# Patient Record
Sex: Male | Born: 1940 | Race: White | Hispanic: No | Marital: Single
Health system: Southern US, Community
[De-identification: ages and names within clinical notes are randomized; demographics above are authoritative.]

## PROBLEM LIST (undated history)

## (undated) DIAGNOSIS — H3320 Serous retinal detachment, unspecified eye: Secondary | ICD-10-CM

---

## 2017-01-30 ENCOUNTER — Emergency Department
Admission: EM | Admit: 2017-01-30 | Discharge: 2017-02-04 | Disposition: A | Discharge status: Home / Self Care | Payer: Self-pay | Attending: Student in an Organized Health Care Education/Training Program | Admitting: Student in an Organized Health Care Education/Training Program

## 2017-01-30 ENCOUNTER — Emergency Department: Payer: Self-pay

## 2017-01-30 ENCOUNTER — Encounter: Payer: Self-pay | Admitting: Emergency Medicine

## 2017-01-30 DIAGNOSIS — F22 Delusional disorders: Secondary | ICD-10-CM

## 2017-01-30 DIAGNOSIS — F28 Other psychotic disorder not due to a substance or known physiological condition: Secondary | ICD-10-CM

## 2017-01-30 DIAGNOSIS — F1210 Cannabis abuse, uncomplicated: Secondary | ICD-10-CM

## 2017-01-30 DIAGNOSIS — F3172 Bipolar disorder, in full remission, most recent episode hypomanic: Secondary | ICD-10-CM

## 2017-01-30 DIAGNOSIS — F4325 Adjustment disorder with mixed disturbance of emotions and conduct: Secondary | ICD-10-CM

## 2017-01-30 DIAGNOSIS — F29 Unspecified psychosis not due to a substance or known physiological condition: Secondary | ICD-10-CM | POA: Insufficient documentation

## 2017-01-30 DIAGNOSIS — F23 Brief psychotic disorder: Secondary | ICD-10-CM

## 2017-01-30 HISTORY — DX: Serous retinal detachment, unspecified eye: H33.20

## 2017-01-30 LAB — COMPREHENSIVE METABOLIC PANEL
ALK PHOS: 75 U/L (ref 38–126)
ALT: 17 U/L (ref 17–63)
AST: 28 U/L (ref 15–41)
Albumin: 4.3 g/dL (ref 3.5–5.0)
Anion gap: 11 (ref 5–15)
BUN: 11 mg/dL (ref 6–20)
CALCIUM: 9 mg/dL (ref 8.9–10.3)
CO2: 25 mmol/L (ref 22–32)
CREATININE: 1.12 mg/dL (ref 0.61–1.24)
Chloride: 102 mmol/L (ref 101–111)
Glucose, Bld: 146 mg/dL — ABNORMAL HIGH (ref 65–99)
Potassium: 3.6 mmol/L (ref 3.5–5.1)
SODIUM: 138 mmol/L (ref 135–145)
Total Bilirubin: 0.6 mg/dL (ref 0.3–1.2)
Total Protein: 8.3 g/dL — ABNORMAL HIGH (ref 6.5–8.1)

## 2017-01-30 LAB — CBC
HCT: 41.3 % (ref 40.0–52.0)
HEMOGLOBIN: 13.8 g/dL (ref 13.0–18.0)
MCH: 30.1 pg (ref 26.0–34.0)
MCHC: 33.4 g/dL (ref 32.0–36.0)
MCV: 90.3 fL (ref 80.0–100.0)
Platelets: 288 10*3/uL (ref 150–440)
RBC: 4.57 MIL/uL (ref 4.40–5.90)
RDW: 13.9 % (ref 11.5–14.5)
WBC: 9.9 10*3/uL (ref 3.8–10.6)

## 2017-01-30 LAB — URINE DRUG SCREEN, QUALITATIVE (ARMC ONLY)
Amphetamines, Ur Screen: NOT DETECTED
BARBITURATES, UR SCREEN: NOT DETECTED
Benzodiazepine, Ur Scrn: NOT DETECTED
CANNABINOID 50 NG, UR ~~LOC~~: POSITIVE — AB
COCAINE METABOLITE, UR ~~LOC~~: NOT DETECTED
MDMA (Ecstasy)Ur Screen: NOT DETECTED
Methadone Scn, Ur: NOT DETECTED
OPIATE, UR SCREEN: NOT DETECTED
PHENCYCLIDINE (PCP) UR S: NOT DETECTED
TRICYCLIC, UR SCREEN: NOT DETECTED

## 2017-01-30 LAB — SALICYLATE LEVEL

## 2017-01-30 LAB — ACETAMINOPHEN LEVEL: Acetaminophen (Tylenol), Serum: <10 ug/mL — ABNORMAL LOW (ref 10–30)

## 2017-01-30 LAB — ETHANOL: Alcohol, Ethyl (B): <5 mg/dL (ref <5)

## 2017-01-30 MED ORDER — ZIPRASIDONE MESYLATE 20 MG IM SOLR
20.0000 mg | Freq: Once | INTRAMUSCULAR | Status: AC
  Administered 2017-01-30: 20 mg via INTRAMUSCULAR

## 2017-01-30 MED ORDER — RISPERIDONE 1 MG PO TABS
1.0000 mg | ORAL_TABLET | Freq: Two times a day (BID) | ORAL | Status: DC
  Administered 2017-01-30 – 2017-02-04 (×9): 1 mg via ORAL
  Filled 2017-01-30 (×10): qty 1

## 2017-01-30 MED ORDER — LORAZEPAM 2 MG/ML IJ SOLN: 2.0000 mg | Freq: Once | INTRAMUSCULAR | Status: DC

## 2017-01-30 MED ORDER — ZIPRASIDONE MESYLATE 20 MG IM SOLR
INTRAMUSCULAR | Status: AC
  Administered 2017-01-30: 20 mg via INTRAMUSCULAR
  Filled 2017-01-30: qty 20

## 2017-01-30 MED ORDER — LORAZEPAM 2 MG/ML IJ SOLN
2.0000 mg | Freq: Once | INTRAMUSCULAR | Status: AC
  Administered 2017-01-30: 2 mg via INTRAMUSCULAR
  Filled 2017-01-30: qty 1

## 2017-01-30 MED ORDER — ZIPRASIDONE MESYLATE 20 MG IM SOLR
20.0000 mg | Freq: Once | INTRAMUSCULAR | Status: AC
  Administered 2017-01-30: 20 mg via INTRAMUSCULAR
  Filled 2017-01-30: qty 20

## 2017-01-30 NOTE — ED Notes (Signed)
BEHAVIORAL HEALTH ROUNDING Patient sleeping: No. Patient alert and oriented: yes Behavior appropriate: No.; If no, describe: Refusing medication, occasionally yelling out, occasionally sitting on the floor: MD aware Nutrition and fluids offered: Yes  Toileting and hygiene offered: Yes  Sitter present: q 15 min checks Law enforcement present: Yes

## 2017-01-30 NOTE — ED Notes (Signed)
BEHAVIORAL HEALTH ROUNDING  Patient sleeping: No.  Patient alert and oriented: yes  Behavior appropriate: Yes. ; If no, describe:  Nutrition and fluids offered: Yes  Toileting and hygiene offered: Yes  Sitter present: not applicable, Q 15 min safety rounds and observation.  Law enforcement present: Yes ODS  

## 2017-01-30 NOTE — ED Notes (Signed)
Patient willingly moved to stretcher from squatting position on the floor and allowed RN to give Ativan injection. Post injection patient was yelling that staff were, "assholes and two-faced."  Patient encouraged to not yell and door was shut to decrease noise to the unit.  Shades on the door were open and lights were on.
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
## 2017-01-30 NOTE — ED Notes (Signed)
BEHAVIORAL HEALTH ROUNDING Patient sleeping: No. Patient alert and oriented: yes Behavior appropriate: Yes.  ; If no, describe:  Nutrition and fluids offered: Yes  Toileting and hygiene offered: Yes  Sitter present: q 15 min checks Law enforcement present: Yes  

## 2017-01-30 NOTE — ED Notes (Signed)
BEHAVIORAL HEALTH ROUNDING Patient sleeping: Yes.   Patient alert and oriented: not applicable SLEEPING Behavior appropriate: Yes.  ; If no, describe: SLEEPING Nutrition and fluids offered: No SLEEPING Toileting and hygiene offered: NoSLEEPING Sitter present: not applicable, Q 15 min safety rounds and observation. Law enforcement present: Yes ODS 

## 2017-01-30 NOTE — ED Triage Notes (Addendum)
Pt presents to ED in custody of [employer], Counselling psychologistfficer Bennett. Per Counselling psychologistfficer Bennett, pt called police due to hearing shots fired, upon police arrival patient was noted to be paranoid with psychotic behavior. Upon arrival to ER, pt refuses to give name and birthday, stating "I was born on the moon, I don't have a birthday". Pt requested to know the time, when patient told the time, pt states "No, I mean the time on the moon!". 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 It's not often that you get to see a [age] year olds dick!" Pt states that he was killed in 2007. [employer] also reports that upon their arrival to patient's home, patient stated that he wanted to die and that he wished [employer] would have shot him and left him in a pile of ashes. Pt changed into scrubs voluntarily, per [employer] IVC papers en route to hospital at this time. When Mellody DanceKeith, Medic drawing blood, pt states, "you're going to find out I'm an alien".

## 2017-01-30 NOTE — ED Notes (Addendum)
Patient began punching the glass on the door hard where it was making a loud noise.  Patient also giving a middle finger to the staff.  Patient asked to stop punching the door and try to relax.  Patient punched the door again.  Kyle PartridgeKeith Caldwell, EDT and Officer [facility] assisted patient back to the bed and held him down by his right and left arm for approximately one minute.  This RN acted as witness.  Once patient stopped struggling he was immediately released.  MD notified that patient was "acting out" and a potential harm to himself.  MD gave verbal orders for 20mg  of geodon IM.

## 2017-01-30 NOTE — ED Notes (Signed)
MD to bedside at this time. IVC paper work arrived to hospital via [employer], Technical sales engineerfficer [place].

## 2017-01-30 NOTE — ED Notes (Signed)
Patient given ginger ale at this time. 

## 2017-01-30 NOTE — ED Notes (Signed)
1 pair of blue pants, 1 pair of brown shoes, 1 pair of socks, 1 gray ring with a blue/green stone placed in patient belongings bag. Pt changed out by Mellody DanceKeith, Medic with assitance from [facility], [employer] Bennett.

## 2017-01-30 NOTE — ED Notes (Signed)
Patient became upset and threw the telephone and slammed the door.  Then patient threw the remainder of his meal tray which included grapes and carrot sticks.  MD notified and acknowledged.  New orders received.

## 2017-01-30 NOTE — ED Provider Notes (Signed)
[facility] Emergency Department Provider Note    None    (approximate)  I have reviewed the triage vital signs and the nursing notes.   HISTORY  Chief Complaint Psychiatric Evaluation  Level V Caveat:  Acute encephalopathy - psychosis  HPI Kyle Caldwell is a 76 y.o. male presents from with acute paranoid psychosis. Patient was brought in under IVC by police after hearing shots fired. Patient very disorganized. States he was "brought against his will to this facility where there are demons and monsters."  Patient denies any other pain.  No substance abuse.   Past Medical History:  Diagnosis Date  . Detached retina    No family history on file. History reviewed. No pertinent surgical history. There are no active problems to display for this patient.     Prior to Admission medications   Not on File    Allergies Morphine and related    Social History Social History  Substance Use Topics  . Smoking status: Not on file  . Smokeless tobacco: Not on file  . Alcohol use Not on file    Review of Systems Patient denies headaches, rhinorrhea, blurry vision, numbness, shortness of breath, chest pain, edema, cough, abdominal pain, nausea, vomiting, diarrhea, dysuria, fevers, rashes unless otherwise stated above in HPI. ____________________________________________   PHYSICAL EXAM:  VITAL SIGNS: Vitals:   01/30/17 0824  BP: (!) 138/106  Pulse: 85  Resp: 18  Temp: 98 F (36.7 C)  SpO2: 99%    Constitutional: Alert, acutely agitated and paranoid, intermittently shouting at staff Eyes: Conjunctivae are normal.  Head: Atraumatic. Nose: No congestion/rhinnorhea. Mouth/Throat: Mucous membranes are moist.   Neck: No stridor. Painless ROM.  Cardiovascular: Normal rate, regular rhythm. Grossly normal heart sounds.  Good peripheral circulation. Respiratory: Normal respiratory effort.  No retractions. Lungs CTAB. Gastrointestinal: Soft and  nontender. No distention. No abdominal bruits. No CVA tenderness. Musculoskeletal: No lower extremity tenderness nor edema.  No joint effusions. Neurologic:  Normal speech and language. No gross focal neurologic deficits are appreciated. No facial droop Skin:  Skin is warm, dry and intact. No rash noted. Psychiatric: agitated, disorganized thought process ____________________________________________   LABS (all labs ordered are listed, but only abnormal results are displayed)  Results for orders placed or performed during the hospital encounter of 01/30/17 (from the past 24 hour(s))  Comprehensive metabolic panel     Status: Abnormal   Collection Time: 01/30/17  8:16 AM  Result Value Ref Range   Sodium 138 135 - 145 mmol/L   Potassium 3.6 3.5 - 5.1 mmol/L   Chloride 102 101 - 111 mmol/L   CO2 25 22 - 32 mmol/L   Glucose, Bld 146 (H) 65 - 99 mg/dL   BUN 11 6 - 20 mg/dL   Creatinine, Ser 1.611.12 0.61 - 1.24 mg/dL   Calcium 9.0 8.9 - 09.610.3 mg/dL   Total Protein 8.3 (H) 6.5 - 8.1 g/dL   Albumin 4.3 3.5 - 5.0 g/dL   AST 28 15 - 41 U/L   ALT 17 17 - 63 U/L   Alkaline Phosphatase 75 38 - 126 U/L   Total Bilirubin 0.6 0.3 - 1.2 mg/dL   GFR calc non Af Amer >60 >60 mL/min   GFR calc Af Amer >60 >60 mL/min   Anion gap 11 5 - 15  Ethanol     Status: None   Collection Time: 01/30/17  8:16 AM  Result Value Ref Range   Alcohol, Ethyl (B) <5 <5  mg/dL  Salicylate level     Status: None   Collection Time: 01/30/17  8:16 AM  Result Value Ref Range   Salicylate Lvl <7.0 2.8 - 30.0 mg/dL  Acetaminophen level     Status: Abnormal   Collection Time: 01/30/17  8:16 AM  Result Value Ref Range   Acetaminophen (Tylenol), Serum <10 (L) 10 - 30 ug/mL  cbc     Status: None   Collection Time: 01/30/17  8:16 AM  Result Value Ref Range   WBC 9.9 3.8 - 10.6 K/uL   RBC 4.57 4.40 - 5.90 MIL/uL   Hemoglobin 13.8 13.0 - 18.0 g/dL   HCT 16.1 09.6 - 04.5 %   MCV 90.3 80.0 - 100.0 fL   MCH 30.1 26.0 - 34.0 pg    MCHC 33.4 32.0 - 36.0 g/dL   RDW 40.9 81.1 - 91.4 %   Platelets 288 150 - 440 K/uL  Urine Drug Screen, Qualitative     Status: Abnormal   Collection Time: 01/30/17  8:18 AM  Result Value Ref Range   Tricyclic, Ur Screen NONE DETECTED NONE DETECTED   Amphetamines, Ur Screen NONE DETECTED NONE DETECTED   MDMA (Ecstasy)Ur Screen NONE DETECTED NONE DETECTED   Cocaine Metabolite,Ur [place] NONE DETECTED NONE DETECTED   Opiate, Ur Screen NONE DETECTED NONE DETECTED   Phencyclidine (PCP) Ur S NONE DETECTED NONE DETECTED   Cannabinoid 50 Ng, Ur [place] POSITIVE (A) NONE DETECTED   Barbiturates, Ur Screen NONE DETECTED NONE DETECTED   Benzodiazepine, Ur Scrn NONE DETECTED NONE DETECTED   Methadone Scn, Ur NONE DETECTED NONE DETECTED   ____________________________________________ ________________________________  RADIOLOGY  I personally reviewed all radiographic images ordered to evaluate for the above acute complaints and reviewed radiology reports and findings.  These findings were personally discussed with the patient.  Please see medical record for radiology report.  ____________________________________________   PROCEDURES  Procedure(s) performed:  Procedures    Critical Care performed: no ____________________________________________   INITIAL IMPRESSION / ASSESSMENT AND PLAN / ED COURSE  Pertinent labs & imaging results that were available during my care of the patient were reviewed by me and considered in my medical decision making (see chart for details).  DDX: Psychosis, delirium, medication effect, noncompliance, polysubstance abuse, Si, Hi, depression   Kyle Caldwell is a 76 y.o. who presents to the ED with for evaluation of acute paranoid psychosis.  Patient has psych history but diagnosis uncertain due to lack of records.  Laboratory testing was ordered to evaluation for underlying electrolyte derangement or signs of underlying organic pathology to explain today's  presentation.  CT head order for acute encephalopathy shows no acute abnormality.  Based on history and physical and laboratory evaluation, it appears that the patient's presentation is 2/2 underlying psychiatric disorder and will require further evaluation and management by inpatient psychiatry.  Patient was  made an IVC due to acute paranoia..  Disposition pending psychiatric evaluation.       ____________________________________________   FINAL CLINICAL IMPRESSION(S) / ED DIAGNOSES  Final diagnoses:  Other psychotic disorder not due to substance or known physiological condition  Acute psychosis  Paranoia (HCC)      NEW MEDICATIONS STARTED DURING THIS VISIT:  New Prescriptions   No medications on file     Note:  This document was prepared using Dragon voice recognition software and may include unintentional dictation errors.    Willy Eddy, MD 01/30/17 1228

## 2017-01-30 NOTE — ED Notes (Signed)
Patient did not cooperate with geodon shot and attempted to get out of the bed to not receive the shot.  Asencion PartridgeKeith Harris, EDT and Officer [place] again held patient's arms and Durene CalHunter, RN held patient's legs for patient to receive Geodon shot.  When [facility] RN, pulled patient's pants down slightly to give him a shot in his thigh, patient states, "don't show everyone my penis!"  Patient was assured by Morrie SheldonAshley, RN that his penis remained covered and patient states, "are you saying that my penis is small you god d@#m c#$t?  My penis is so large I could f#&k you in the ass and the pussy so hard it would make your ass and pussy bleed!"  MD entered room and attempted to verbally de-escalate patient.

## 2017-01-30 NOTE — ED Notes (Signed)
Decaf, chilled coffee given to pt upon request

## 2017-01-30 NOTE — ED Notes (Signed)
Pt talking to SOC MD 

## 2017-01-30 NOTE — ED Provider Notes (Signed)
Patient is now punching the glass of the door repeatedly, he did receive the Ativan but thus far is still very aggressive and belligerent. I have asked the nurse to draw up Geodon for patient and staff safety   Jene EveryKinner, Valerye Kobus, MD 01/30/17 [phone]

## 2017-01-30 NOTE — ED Notes (Signed)
BEHAVIORAL HEALTH ROUNDING Patient sleeping: Sleeping Patient alert and oriented: sleeping Behavior appropriate: Patient sleeping at this time; If no, describe: see notes below regarding prior activity Nutrition and fluids offered: Yes  Toileting and hygiene offered: Yes  Sitter present: q 15 min checks Law enforcement present: Yes

## 2017-01-30 NOTE — ED Notes (Signed)
Patient is squatting on the floor eating his dinner and speaking with the security guard calmly.  Patient is in no obvious distress at this time.

## 2017-01-30 NOTE — BH Assessment (Signed)
Referral information for Psychiatric Hospitalization faxed to;    [facility] [phone]),    [facility] [phone]),   [facility] [phone]),    [place] [phone]    [facility] [phone]),    [facility] [phone]   [facility] [phone])

## 2017-01-30 NOTE — ED Notes (Signed)
Patient sitting calmly with [name] sitting at bedside.  Will continue to monitor.

## 2017-01-30 NOTE — ED Notes (Signed)

## 2017-01-30 NOTE — ED Notes (Signed)
BEHAVIORAL HEALTH ROUNDING Patient sleeping: Yes.   Patient alert and oriented: sleeping Behavior appropriate: Yes.  ; If no, describe:  Nutrition and fluids offered: Yes  Toileting and hygiene offered: Yes  Sitter present: q 15 min checks Law enforcement present: Yes  

## 2017-01-30 NOTE — ED Provider Notes (Signed)
Patient is sitting on the floor at his door singing very loudly. He was asked to stop as he was disturbing patient's he then became angry and took his food and threw it all around the room. He is becoming aggressive with staff. Discussed with nurse and we will give 2 mg IM Ativan   Jene EveryKinner, Emilyanne Mcgough, MD 01/30/17 1711

## 2017-01-30 NOTE — ED Notes (Signed)
Checked in on patient to offer snack and drink, patient sleeping. AS
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
## 2017-01-30 NOTE — ED Notes (Signed)
Pt sitting on floor eating lunch.

## 2017-01-31 MED ORDER — ZIPRASIDONE MESYLATE 20 MG IM SOLR
20.0000 mg | Freq: Once | INTRAMUSCULAR | Status: AC
  Administered 2017-01-31: 20 mg via INTRAMUSCULAR

## 2017-01-31 MED ORDER — NICOTINE 14 MG/24HR TD PT24
MEDICATED_PATCH | TRANSDERMAL | Status: AC
  Filled 2017-01-31: qty 1

## 2017-01-31 MED ORDER — ZIPRASIDONE MESYLATE 20 MG IM SOLR
20.0000 mg | Freq: Once | INTRAMUSCULAR | Status: AC
  Administered 2017-01-31: 20 mg via INTRAMUSCULAR
  Filled 2017-01-31: qty 20

## 2017-01-31 MED ORDER — ZIPRASIDONE MESYLATE 20 MG IM SOLR
INTRAMUSCULAR | Status: AC
  Filled 2017-01-31: qty 20

## 2017-01-31 MED ORDER — ACETAMINOPHEN 500 MG PO TABS
1000.0000 mg | ORAL_TABLET | Freq: Once | ORAL | Status: AC
  Administered 2017-01-31: 1000 mg via ORAL
  Filled 2017-01-31: qty 2

## 2017-01-31 MED ORDER — NICOTINE 21 MG/24HR TD PT24
21.0000 mg | MEDICATED_PATCH | Freq: Once | TRANSDERMAL | Status: AC
  Administered 2017-01-31: 21 mg via TRANSDERMAL
  Filled 2017-01-31: qty 1

## 2017-01-31 NOTE — Progress Notes (Signed)
LCSW called the following geri psych units   [facility] [phone] spoke to AmesJackie who reported she did not receive fax: LCSW resent it. No beds until Monday  [facility] [phone] Called and left message.  [facility] [phone] Called and spoke to Jorja Loaim declined no insurance  [facility] [phone] Called and spoke to [place] patient is declined no insurance  [facility] [phone] Natale LaySpoke Ron no beds today  [facility] [phone] Spoke to [place]- no beds     [employer] LinesClaudine Darrel Gloss LCSW [phone]
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
## 2017-01-31 NOTE — ED Notes (Signed)
BEHAVIORAL HEALTH ROUNDING Patient sleeping: Yes.   Patient alert and oriented: not applicable SLEEPING Behavior appropriate: Yes.  ; If no, describe: SLEEPING Nutrition and fluids offered: No SLEEPING Toileting and hygiene offered: NoSLEEPING Sitter present: not applicable, Q 15 min safety rounds and observation. Law enforcement present: Yes ODS 

## 2017-01-31 NOTE — ED Notes (Signed)
Decaf coffee given and pt talking more softly at this time. Will continue to monitor for escalating behavior.

## 2017-01-31 NOTE — ED Notes (Signed)
Pt squatting in doorway to room talking to self. Pt reminded to stay in room.

## 2017-01-31 NOTE — ED Notes (Signed)
Pt given change of clothes. Pt states the clothes he has on smelled like vomit.

## 2017-01-31 NOTE — ED Notes (Signed)
Pt standing in doorway threshold cursing at staff. Requesting someone to check on his cats. Pt verbally redirected to his room by staff. Banging on the door momentarily. Pt in room at this time, door shut and blinds open. Pt visible.

## 2017-01-31 NOTE — ED Notes (Addendum)
Pt talking loudly, using curse words at staff. Pt appears agitated, continues to step out of room. Threatening to throw water in staff face. Tried to get patient to go back to room with verbal orders.

## 2017-01-31 NOTE — ED Notes (Signed)
EDP informed about pt increasing agitated behavior.

## 2017-01-31 NOTE — ED Notes (Addendum)
Pt awake, sitting in floor of door threshold inuring about breakfast. Cursing at staff.

## 2017-01-31 NOTE — ED Notes (Addendum)
Pt cursing at staff, interrupting with other patient's care.

## 2017-01-31 NOTE — ED Notes (Signed)
BEHAVIORAL HEALTH ROUNDING  Patient sleeping: No.  Patient alert and oriented: yes  Behavior appropriate: No ; If no, describe: restless, agitated see note below Nutrition and fluids offered: Yes  Toileting and hygiene offered: Yes  Sitter present: not applicable, Q 15 min safety rounds and observation.  Law enforcement present: Yes ODS  Pt upset that he is here, talking loudly, asking for coffee. Requested pt to talk less loudly as he was disturbing other patients in the area that are trying to sleep. Agreed to give pt a cup of decaf coffee if he would remain calm at this time.
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
## 2017-01-31 NOTE — ED Notes (Signed)
Pt momentarily held by ODS officer and EMTP Mellody DanceKeith. Pam, EDT to assist this RN. Pt states to this RN as administering medication "why don't you suck my dick while you're down there?".

## 2017-01-31 NOTE — ED Notes (Signed)
Pt awake and up to bathroom. Pt talking loudly and asking for coffee. Explained to pt that coffee was not available at this time and pt offered water. Pt agreeable. Pt asking if he was in the "looney bin". Explained to pt that he was still in the ED and was waiting for inpatient placement for mental health. Pt encouraged to try to rest until time for breakfast.

## 2017-01-31 NOTE — ED Notes (Signed)
Pt standing in doorway with blanket, cursing at this Clinical research associatewriter. Pt refusing to take PO medication at this time.

## 2017-01-31 NOTE — ED Notes (Signed)
Pt given water to drink and pt threw it in the floor.

## 2017-01-31 NOTE — ED Notes (Signed)
Pt ambulated to br without assistance.

## 2017-01-31 NOTE — ED Notes (Addendum)
Pt calling the EDP a "faggot doctor". Walking out of room and pointing to other patient's stating that they need things. Informed patient to stay away from other patients and their rooms.

## 2017-01-31 NOTE — ED Notes (Signed)
Pt given supper tray and ask to return to his room to eat. Pt sat at the door and ate his meal. Pt stated "oh pizza. Just what I wanted."

## 2017-01-31 NOTE — ED Notes (Addendum)
Kyle Caldwell, BHU nurse will look over pt chart to [facility] aware and approves of this

## 2017-01-31 NOTE — ED Notes (Signed)
Pt resting in room, lying on mattress. RR even and unlabored. Color WNL.

## 2017-01-31 NOTE — ED Notes (Addendum)
Pt given drink per request. Upon entering room pt touching his genitals, stopped upon RN into room.

## 2017-01-31 NOTE — ED Notes (Signed)
BEHAVIORAL HEALTH ROUNDING  Patient sleeping: No.  Patient alert and oriented: yes  Behavior appropriate: Yes. ; If no, describe:  Nutrition and fluids offered: Yes  Toileting and hygiene offered: Yes  Sitter present: not applicable, Q 15 min safety rounds and observation.  Law enforcement present: Yes ODS  

## 2017-01-31 NOTE — ED Provider Notes (Signed)
-----------------------------------------   7:11 AM on 01/31/2017 -----------------------------------------   Blood pressure (!) 138/97, pulse 80, temperature 97.8 F (36.6 C), temperature source Oral, resp. rate 18, height 6\' 2"  (1.88 m), weight 63.5 kg (140 lb), SpO2 99 %.  The patient had no acute events since last update.  Resting at this time.  Disposition is pending Psychiatry/Behavioral Medicine team recommendations.     Irean HongSung, Angeligue Bowne J, MD 01/31/17 [phone]

## 2017-01-31 NOTE — ED Notes (Signed)
Pt walking back and forth to the bathroom stating "this is F...ed up" repeatedly while looking at this tech.

## 2017-01-31 NOTE — ED Notes (Addendum)
Pt offered beverage, refused. Attempted to get patient back into room with verbal orders, pt reluctant   Pt eating lunch tray.

## 2017-01-31 NOTE — ED Notes (Signed)
Pt requesting phone call, given phone.

## 2017-01-31 NOTE — BH Assessment (Signed)
Writer received phone call from hospitals;   [facility] ([facility])-Patient been reviewed for their wait list   Kyle Caldwell (Kyle Caldwell), patient to acute at this time. Was advised to call back after 24 hours.

## 2017-01-31 NOTE — ED Notes (Signed)
Pt in room with door closed and singing [place] loudly. Pt reminded that other patients are trying to sleep. Pt then began to softly sing a lullaby.

## 2017-01-31 NOTE — ED Notes (Signed)
Pt given sprite, eating meal tray at this time.

## 2017-01-31 NOTE — ED Notes (Addendum)
Pt still sitting in doorway of room, mumbling to himself. Yelling curse words at staff.

## 2017-01-31 NOTE — ED Notes (Signed)
Pt states to this RN as this RN taking pt oral temperature that "this is sexy that a young nurse is taking my temperature"

## 2017-01-31 NOTE — ED Notes (Signed)
PT IVC/ PENDING PLACEMENT  

## 2017-01-31 NOTE — ED Notes (Signed)
ODS officer at bedside currently.

## 2017-01-31 NOTE — ED Notes (Signed)
ENVIRONMENTAL ASSESSMENT  Potentially harmful objects out of patient reach: Yes.  Personal belongings secured: Yes.  Patient dressed in hospital provided attire only: Yes.  Plastic bags out of patient reach: Yes.  Patient care equipment (cords, cables, call bells, lines, and drains) shortened, removed, or accounted for: Yes.  Equipment and supplies removed from bottom of stretcher: Yes.  Potentially toxic materials out of patient reach: Yes.  Sharps container removed or out of patient reach: Yes.   BEHAVIORAL HEALTH ROUNDING  Patient sleeping: No.  Patient alert and oriented: yes  Behavior appropriate: calm at this time. ; If no, describe:  Nutrition and fluids offered: Yes  Toileting and hygiene offered: Yes  Sitter present: not applicable, Q 15 min safety rounds and observation.  Law enforcement present: Yes ODS  ED BHU PLACEMENT JUSTIFICATION  Is the patient under IVC or is there intent for IVC: Yes.  Is the patient medically cleared: Yes.  Is there vacancy in the ED BHU: Yes.  Is the population mix appropriate for patient: Yes.  Is the patient awaiting placement in inpatient or outpatient setting: Yes.  Has the patient had a psychiatric consult: Yes.  Survey of unit performed for contraband, proper placement and condition of furniture, tampering with fixtures in bathroom, shower, and each patient room: Yes. ; Findings: All clear  APPEARANCE/BEHAVIOR  Calm at this time NEURO ASSESSMENT  Orientation: time, place and person  Hallucinations: No.None noted (Hallucinations)  Speech: Normal Gait: normal  RESPIRATORY ASSESSMENT  WNL  CARDIOVASCULAR ASSESSMENT  WNL  GASTROINTESTINAL ASSESSMENT  WNL  EXTREMITIES  WNL  PLAN OF CARE  Provide calm/safe environment. Vital signs assessed TID. ED BHU Assessment once each 12-hour shift. Collaborate with TTS daily or as condition indicates. Assure the ED provider has rounded once each shift. Provide and encourage hygiene. Provide  redirection as needed. Assess for escalating behavior; address immediately and inform ED provider.  Assess family dynamic and appropriateness for visitation as needed: Yes. ; If necessary, describe findings:  Educate the patient/family about BHU procedures/visitation: Yes. ; If necessary, describe findings: Pt is calm at this time.Will continue to monitor with Q 15 min safety rounds and observation

## 2017-01-31 NOTE — ED Notes (Signed)
Pt moving stretcher in front of door so that staff cannot enter. Pt stretcher moved and taken away from patient, mattress placed on floor. Pt yelling at staff, calling ODS officer "big gorilla" and this RN "bitch". Pt lying on mattress at this time, pt lowered himself onto mattress with no falls.   ODS officer, EMTP Mellody DanceKeith and Theodoro Gristave at bedside to assist. Pt arm momentarily held by ODS officer. Pt took IM shot cooperatively.  Pt now lying in stretcher singing loudly

## 2017-01-31 NOTE — ED Provider Notes (Signed)
The patient has been escalating over the past several hours. He is now attempting to get out of his room and interfere with the care of other patients. We attempted verbal de-escalation over the past 3 hours, however the patient remains noncompliant. Intramuscular Geodon will be given for the patient's own safety.   Merrily Brittleifenbark, Cashmere Dingley, MD 01/31/17 [phone]

## 2017-02-01 MED ORDER — LORAZEPAM 2 MG PO TABS
2.0000 mg | ORAL_TABLET | Freq: Once | ORAL | Status: AC
Start: 2017-02-01 — End: 2017-02-01
  Administered 2017-02-01: 2 mg via ORAL
  Filled 2017-02-01: qty 1

## 2017-02-01 MED ORDER — NICOTINE 21 MG/24HR TD PT24
MEDICATED_PATCH | TRANSDERMAL | Status: AC
  Filled 2017-02-01: qty 1

## 2017-02-01 MED ORDER — ONDANSETRON 4 MG PO TBDP
ORAL_TABLET | ORAL | Status: AC
  Administered 2017-02-01: 4 mg via ORAL
  Filled 2017-02-01: qty 1

## 2017-02-01 MED ORDER — ONDANSETRON 4 MG PO TBDP
4.0000 mg | ORAL_TABLET | Freq: Once | ORAL | Status: AC
  Administered 2017-02-01: 4 mg via ORAL

## 2017-02-01 MED ORDER — ACETAMINOPHEN 325 MG PO TABS
650.0000 mg | ORAL_TABLET | Freq: Once | ORAL | Status: AC
  Administered 2017-02-01: 650 mg via ORAL
  Filled 2017-02-01: qty 2

## 2017-02-01 MED ORDER — NICOTINE 21 MG/24HR TD PT24
21.0000 mg | MEDICATED_PATCH | Freq: Once | TRANSDERMAL | Status: AC
  Administered 2017-02-01: 21 mg via TRANSDERMAL

## 2017-02-01 NOTE — ED Notes (Signed)
IVC/  PENDING  PLACEMENT 

## 2017-02-01 NOTE — ED Notes (Signed)
ED  Is the patient under IVC or is there intent for IVC: Yes.   Is the patient medically cleared: Yes.   Is there vacancy in the ED BHU: Yes.   Is the population mix appropriate for patient: Yes.   Is the patient awaiting placement in inpatient or outpatient setting: Yes.  inpt geriatric placement Has the patient had a psychiatric consult: Yes.   Survey of unit performed for contraband, proper placement and condition of furniture, tampering with fixtures in bathroom, shower, and each patient room: Yes.  ; Findings:  APPEARANCE/BEHAVIOR Calm and cooperative NEURO ASSESSMENT Orientation: oriented x3  Denies pain Hallucinations: No  denies.None noted (Hallucinations) Speech: Normal Gait: normal RESPIRATORY ASSESSMENT Even  Unlabored respirations  CARDIOVASCULAR ASSESSMENT Pulses equal   regular rate  Skin warm and dry   GASTROINTESTINAL ASSESSMENT no GI complaint EXTREMITIES Full ROM  PLAN OF CARE Provide calm/safe environment. Vital signs assessed twice daily. ED BHU Assessment once each 12-hour shift. Collaborate with TTS daily or as condition indicates. Assure the ED provider has rounded once each shift. Provide and encourage hygiene. Provide redirection as needed. Assess for escalating behavior; address immediately and inform ED provider.  Assess family dynamic and appropriateness for visitation as needed: Yes.  ; If necessary, describe findings:  Educate the patient/family about BHU procedures/visitation: Yes.  ; If necessary, describe findings:

## 2017-02-01 NOTE — ED Notes (Signed)
BEHAVIORAL HEALTH ROUNDING  Patient sleeping: No.  Patient alert and oriented: yes  Behavior appropriate: Yes. ; If no, describe:  Nutrition and fluids offered: Yes  Toileting and hygiene offered: Yes  Sitter present: not applicable, Q 15 min safety rounds and observation.  Law enforcement present: Yes ODS  

## 2017-02-01 NOTE — ED Notes (Signed)
BEHAVIORAL HEALTH ROUNDING Patient sleeping: Yes.   Patient alert and oriented: not applicable SLEEPING Behavior appropriate: Yes.  ; If no, describe: SLEEPING Nutrition and fluids offered: No SLEEPING Toileting and hygiene offered: NoSLEEPING Sitter present: not applicable, Q 15 min safety rounds and observation. Law enforcement present: Yes ODS 

## 2017-02-01 NOTE — ED Notes (Signed)
He can be seen and heard in the hallway during report  - NAD observed  I reoriented him and encouraged him to stay in his room   Pt verbalized agreement
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
## 2017-02-01 NOTE — ED Notes (Signed)
BEHAVIORAL HEALTH ROUNDING  Patient sleeping: No.  Patient alert and oriented: yes  Behavior appropriate: Yes. ; If no, describe:  Nutrition and fluids offered: Yes  Toileting and hygiene offered: Yes  Sitter present: not applicable, Q 15 min safety rounds and observation.  Law enforcement present: Yes ODS  Pt sitting in doorway to room and complaining that he feels nauseated. Dr Dolores FrameSung informed and new orders received.

## 2017-02-01 NOTE — ED Notes (Signed)

## 2017-02-01 NOTE — ED Notes (Signed)
BEHAVIORAL HEALTH ROUNDING Patient sleeping: No. Patient alert and oriented: yes Behavior appropriate: Yes.  ; If no, describe:  Nutrition and fluids offered: yes Toileting and hygiene offered: Yes  Sitter present: q15 minute observations and security  monitoring Law enforcement present: Yes  ODS  

## 2017-02-01 NOTE — ED Notes (Signed)
He is visiting with the chaplin at this time  NAD observed
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
## 2017-02-01 NOTE — ED Notes (Signed)
Pt states he is uncomfortable on the floor. Explained to the patient that his stretcher would be placed back in the room as long as he did not use it to block off the door again like he had earlier on day shift.

## 2017-02-01 NOTE — Progress Notes (Signed)
CH received a PG from the ED to come and talk with PT. PT is in ED20 being observed for behavioral health. CH listened to PT tell his story of how he got    02/01/17 1345  Clinical Encounter Type  Visited With Patient  Visit Type Initial  Referral From Nurse  Consult/Referral To Chaplain  Spiritual Encounters  Spiritual Needs Emotional  put into hospital. Ch will follow up later.

## 2017-02-02 MED ORDER — LORAZEPAM 1 MG PO TABS
1.0000 mg | ORAL_TABLET | ORAL | Status: DC | PRN
  Administered 2017-02-02 – 2017-02-04 (×7): 1 mg via ORAL
  Filled 2017-02-02 (×7): qty 1

## 2017-02-02 NOTE — ED Notes (Signed)
meds given.  Pt verbally abusive to staff.  Vital signs taken .

## 2017-02-02 NOTE — ED Notes (Signed)
Pt squatting in room, states this is how I stretch after waking up.

## 2017-02-02 NOTE — ED Notes (Signed)
Pt awake drinking decaf coffee, calm

## 2017-02-02 NOTE — ED Notes (Signed)
Ivc /pending placement 

## 2017-02-02 NOTE — BH Assessment (Signed)
Pt placement referral resubmitted to [facility]. 

## 2017-02-02 NOTE — ED Notes (Signed)
Pt ambulated to bathroom without incidence.

## 2017-02-02 NOTE — ED Provider Notes (Signed)
  Physical Exam  BP 112/71 (BP Location: Left Arm)   Pulse 82   Temp 98 F (36.7 C) (Oral)   Resp 16   Ht 6\' 2"  (1.88 m)   Wt 63.5 kg (140 lb)   SpO2 98%   BMI 17.97 kg/m   Physical Exam  ED Course  Procedures  MDM Patient under IVC. No issues per nursing. Pending geri psych placement       Charlynne PanderYao, Latice Waitman Hsienta, MD 02/02/17 [phone]

## 2017-02-02 NOTE — ED Notes (Signed)
Patient asked to use phone and phone given. Patient is becoming verbally aggressive due to not knowing numbers he wants to call and us not looking up a lawyer number for him. Pt stated "This is god damn [place] and I cannot memorize numbers" This RN has looked all in chart and there are no numbers for contacts in his chart

## 2017-02-02 NOTE — ED Notes (Signed)
Resumed care from amber rn.  Pt sleeping 

## 2017-02-03 MED ORDER — IBUPROFEN 100 MG/5ML PO SUSP: 400.0000 mg | Freq: Once | ORAL | Status: DC

## 2017-02-03 MED ORDER — IBUPROFEN 400 MG PO TABS
400.0000 mg | ORAL_TABLET | Freq: Once | ORAL | Status: AC
  Administered 2017-02-03: 400 mg via ORAL

## 2017-02-03 MED ORDER — IBUPROFEN 400 MG PO TABS
ORAL_TABLET | ORAL | Status: AC
  Filled 2017-02-03: qty 1

## 2017-02-03 NOTE — BH Assessment (Signed)
Clinician contacted Kyle Caldwell to f/u on placement referral. Representative Efraim Kaufmann(Melissa) unaware of referral. Clinician resubmitted referral.

## 2017-02-03 NOTE — ED Notes (Signed)
Pt ambulated to bathroom without incidence.

## 2017-02-03 NOTE — ED Notes (Signed)
Patient in room talking on phone a this time

## 2017-02-03 NOTE — BH Assessment (Signed)
Called [facility] to follow up with referral.  There was no answer and a message was left on the voice mail. 

## 2017-02-03 NOTE — ED Provider Notes (Signed)
-----------------------------------------   6:40 AM on 02/03/2017 -----------------------------------------   Blood pressure 101/75, pulse 80, temperature 98.1 F (36.7 C), temperature source Oral, resp. rate 18, height 6\' 2"  (1.88 m), weight 63.5 kg (140 lb), SpO2 98 %.  The patient had no acute events since last update.  Sleeping at this time.  Disposition is pending Psychiatry/Behavioral Medicine team recommendations.     Irean HongSung, Jade J, MD 02/03/17 [phone]

## 2017-02-03 NOTE — BH Assessment (Signed)
Pt denied @ [facility] due to acuity.

## 2017-02-03 NOTE — ED Notes (Signed)
Report off to kaitlin rn 

## 2017-02-03 NOTE — ED Notes (Signed)
Pt given a sandwich tray ?

## 2017-02-03 NOTE — ED Notes (Signed)
IVC pending placement 

## 2017-02-04 DIAGNOSIS — F4325 Adjustment disorder with mixed disturbance of emotions and conduct: Secondary | ICD-10-CM

## 2017-02-04 DIAGNOSIS — F1210 Cannabis abuse, uncomplicated: Secondary | ICD-10-CM

## 2017-02-04 DIAGNOSIS — F3172 Bipolar disorder, in full remission, most recent episode hypomanic: Secondary | ICD-10-CM

## 2017-02-04 MED ORDER — RISPERIDONE 1 MG PO TABS: 1.0000 mg | ORAL_TABLET | Freq: Two times a day (BID) | ORAL | 2 refills | Status: AC

## 2017-02-04 MED ORDER — RISPERIDONE 1 MG PO TABS: 1.0000 mg | ORAL_TABLET | Freq: Two times a day (BID) | ORAL | 1 refills | Status: AC

## 2017-02-04 MED ORDER — NICOTINE 21 MG/24HR TD PT24
21.0000 mg | MEDICATED_PATCH | Freq: Every day | TRANSDERMAL | Status: DC
  Administered 2017-02-04: 21 mg via TRANSDERMAL

## 2017-02-04 MED ORDER — NICOTINE 21 MG/24HR TD PT24
MEDICATED_PATCH | TRANSDERMAL | Status: DC
Start: 2017-02-04 — End: 2017-02-04
  Filled 2017-02-04: qty 1

## 2017-02-04 NOTE — ED Notes (Signed)
Spoke with sheriff dept for pt address and they are going to give pt ride home. Pt is alert and oriented X 3.

## 2017-02-04 NOTE — ED Notes (Signed)
Waiting on sheriff ride home for discharge.
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
## 2017-02-04 NOTE — ED Notes (Signed)
Pt. States his relationship with younger son has deteriorated over the years.  Pt. States he would be willing to live with older son if possible.  Pt. States he does not know phone #, but his name is Kyle Caldwell.

## 2017-02-04 NOTE — ED Notes (Signed)
Spoke and listened to pt. For about 20 minutes about his attitude in the quad.  Pt. Admits he has been using foul language when speaking with staff.  Pt. Agreed to be more mindful on language and improve attitude with staff.

## 2017-02-04 NOTE — ED Provider Notes (Signed)
-----------------------------------------   9:20 AM on 02/04/2017 -----------------------------------------   Blood pressure (!) 144/83, pulse 78, temperature 97.7 F (36.5 C), temperature source Oral, resp. rate 17, height 6\' 2"  (1.88 m), weight 63.5 kg (140 lb), SpO2 99 %.  The patient had no acute events since last update. [facility], currently awaiting their decision. Nicotine patch has been reordered for the patient.    Minna AntisPaduchowski, Brendon Christoffel, MD 02/04/17 [phone]

## 2017-02-04 NOTE — ED Notes (Signed)
[place]. Pt left with them for discharge home

## 2017-02-04 NOTE — ED Notes (Signed)
Pt eating breakfast tray 

## 2017-02-04 NOTE — ED Notes (Signed)
Requested pt remain in room. Keeps coming in hallway to talk with officer.

## 2017-05-22 DEATH — deceased

## 2018-12-01 IMAGING — CT CT HEAD W/O CM
3 series · 14 of 47 positions shown, 16 images · non-contrast
Comparison: None

CLINICAL DATA: Found by police demonstrating paranoid and psychotic
behavior, altered level of consciousness

EXAM:
CT HEAD WITHOUT CONTRAST
TECHNIQUE: Contiguous axial images were obtained from the base of the skull
through the vertex without intravenous contrast. Sagittal and
coronal MPR images reconstructed from axial data set.

[Series 3: head wo · axial · 0.47mm/px · z∈[-62,+63]mm · 8 of 30 slices shown, 10 images]
[im 3/30  brain]
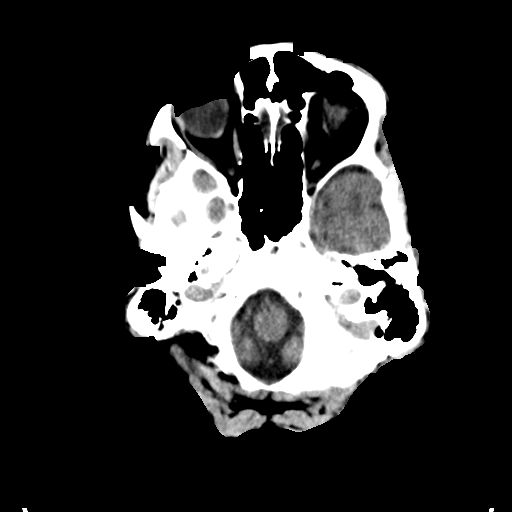
[im 3/30  bone]
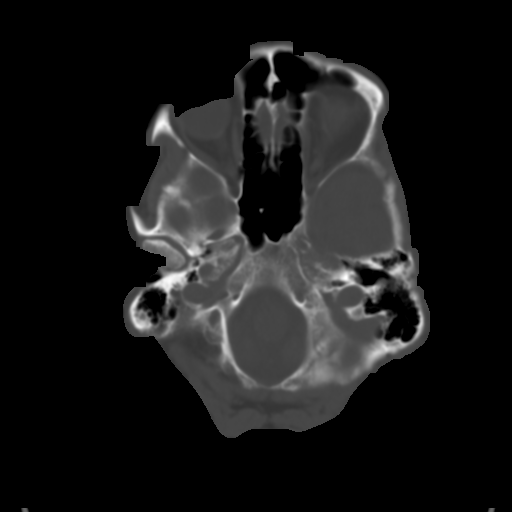
[im 7/30  brain]
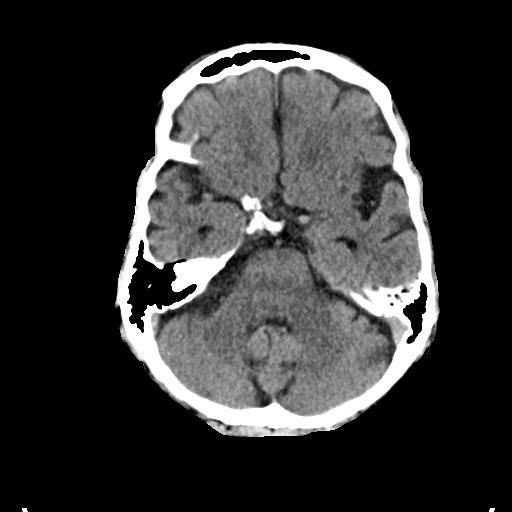
[im 10/30  brain]
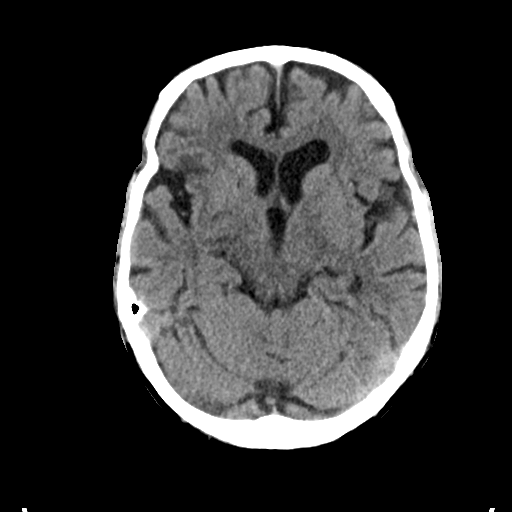
[im 14/30  brain]
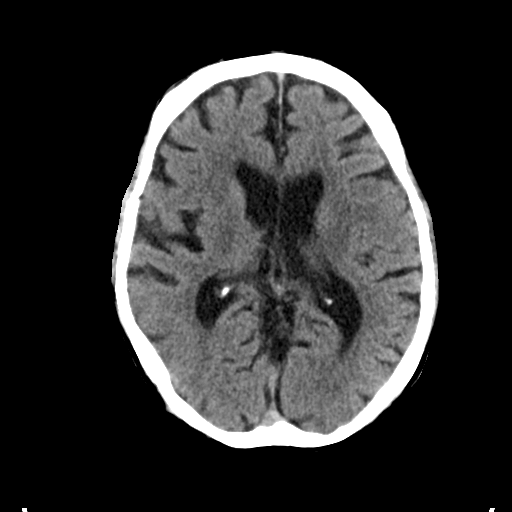
[im 17/30  brain]
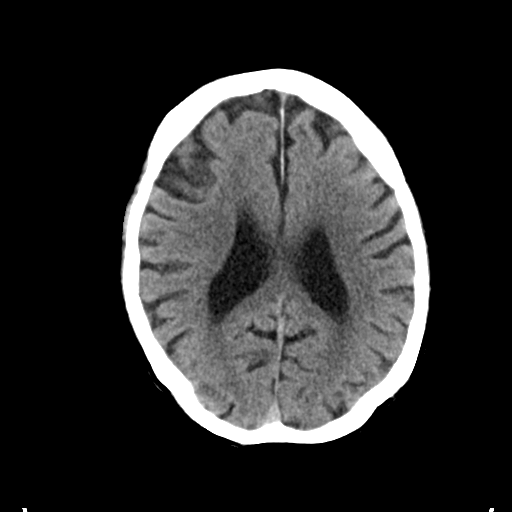
[im 17/30  bone]
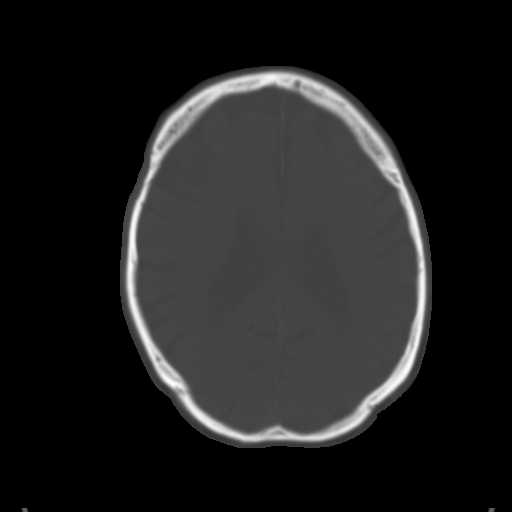
[im 21/30  brain]
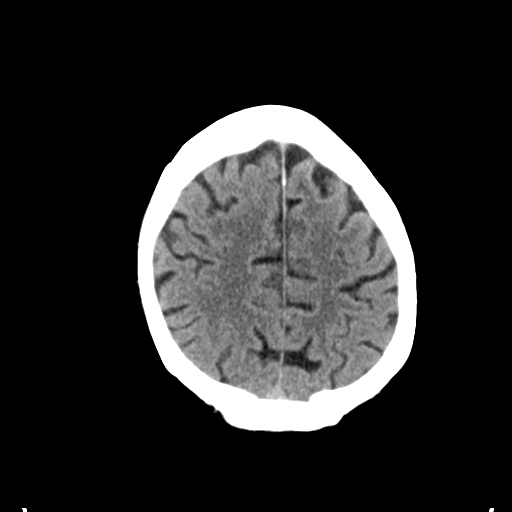
[im 24/30  brain]
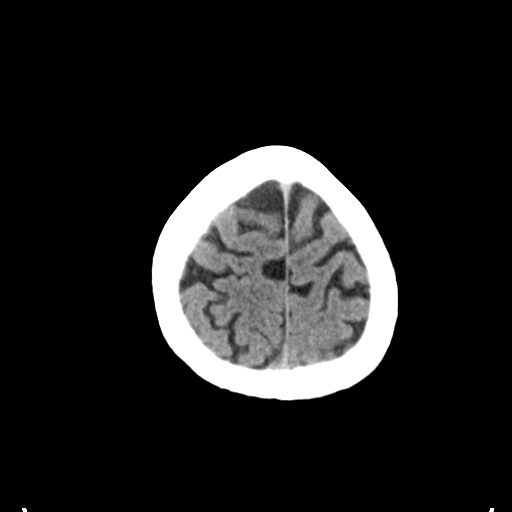
[im 28/30  brain]
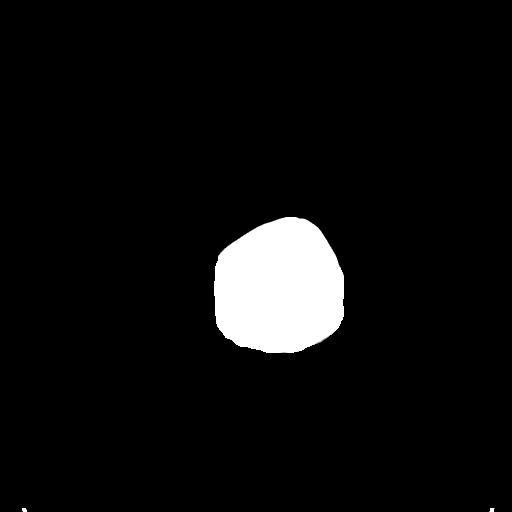

[Series 4: coronal soft tissue · coronal · 0.30mm/px · 3 of 67 slices shown]
[im 23/67  brain]
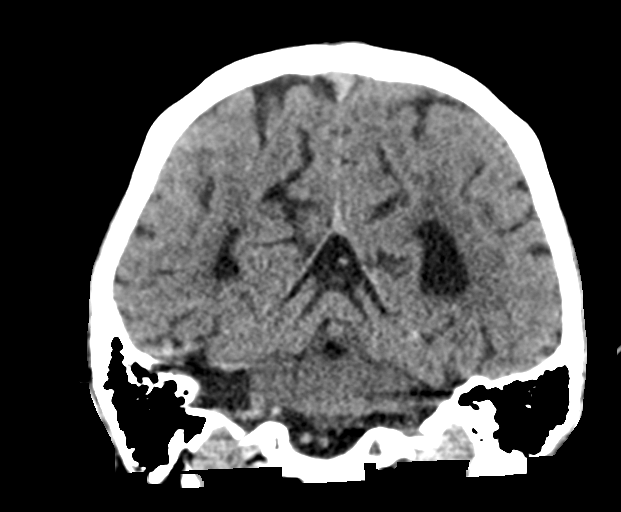
[im 30/67  brain]
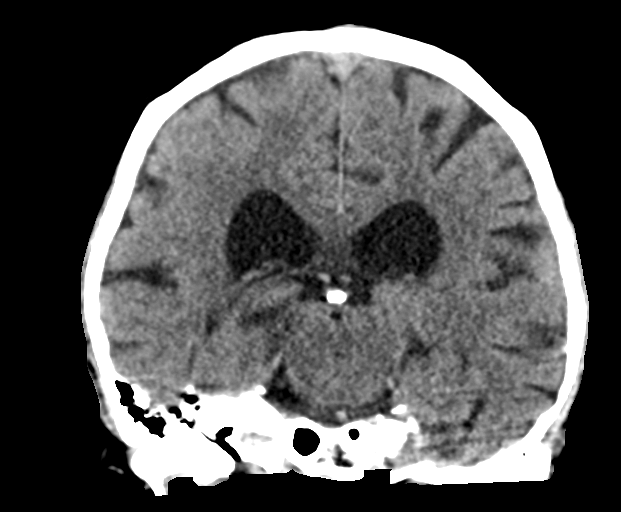
[im 37/67  brain]
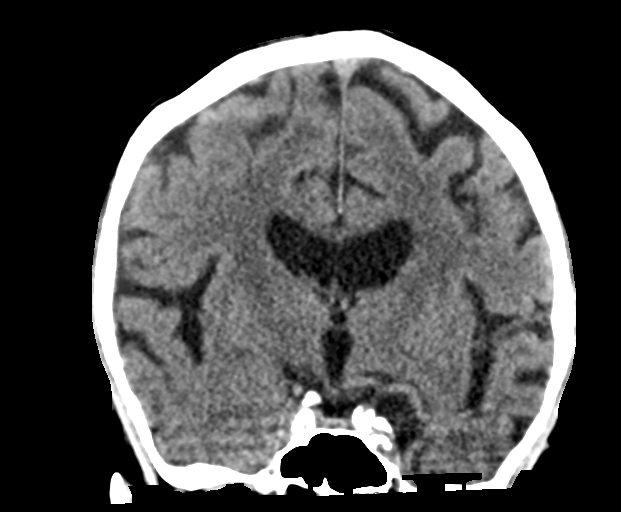

[Series 5: sagittal soft tissue · sagittal · 0.30mm/px · 3 of 60 slices shown]
[im 20/60  brain]
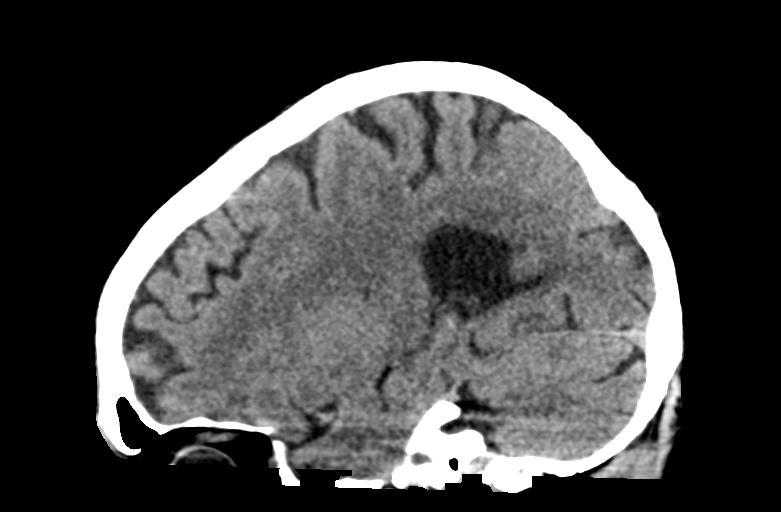
[im 30/60  brain]
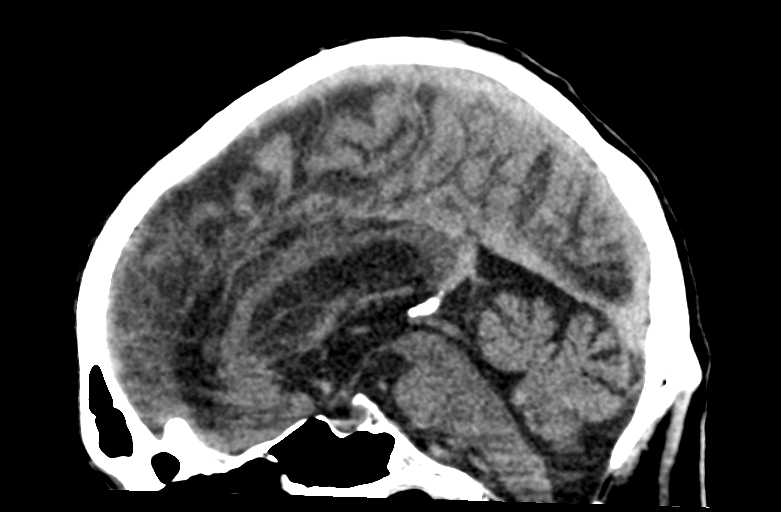
[im 40/60  brain]
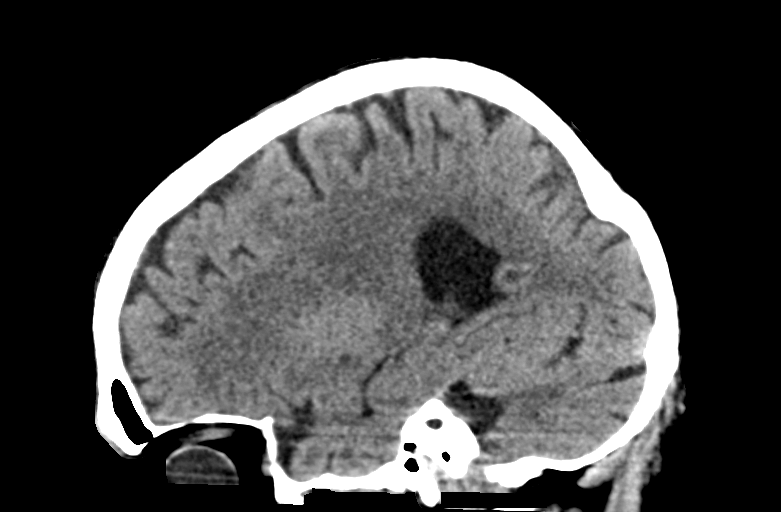

[14 of 47 positions shown; findings below may reference images not displayed]

FINDINGS: Brain: Generalized atrophy. Normal ventricular morphology. No
midline shift or mass effect. Small vessel chronic ischemic changes
of deep cerebral white matter. No intracranial hemorrhage, mass
lesion, evidence of acute infarction, or extra-axial fluid
collection.

Vascular: Atherosclerotic calcification of internal carotid arteries
bilaterally at skullbase

Skull: Demineralized but intact

Sinuses/Orbits: Clear

Other: N/A
IMPRESSION: Atrophy with minimal small vessel chronic ischemic changes of deep
cerebral white matter.

No acute intracranial abnormalities.
